# Patient Record
Sex: Male | Born: 1960 | Race: White | Hispanic: No | Marital: Married | Smoking: Current every day smoker
Health system: Southern US, Community
[De-identification: ages and names within clinical notes are randomized; demographics above are authoritative.]

---

## 2014-01-28 DIAGNOSIS — M5030 Other cervical disc degeneration, unspecified cervical region: Secondary | ICD-10-CM | POA: Insufficient documentation

## 2014-01-28 DIAGNOSIS — I639 Cerebral infarction, unspecified: Secondary | ICD-10-CM | POA: Insufficient documentation

## 2014-01-28 DIAGNOSIS — I739 Peripheral vascular disease, unspecified: Secondary | ICD-10-CM | POA: Insufficient documentation

## 2014-01-28 DIAGNOSIS — E7800 Pure hypercholesterolemia, unspecified: Secondary | ICD-10-CM | POA: Insufficient documentation

## 2014-01-28 DIAGNOSIS — M546 Pain in thoracic spine: Secondary | ICD-10-CM | POA: Insufficient documentation

## 2014-01-28 DIAGNOSIS — M47816 Spondylosis without myelopathy or radiculopathy, lumbar region: Secondary | ICD-10-CM | POA: Insufficient documentation

## 2014-01-28 DIAGNOSIS — F419 Anxiety disorder, unspecified: Secondary | ICD-10-CM | POA: Insufficient documentation

## 2014-01-28 DIAGNOSIS — M5136 Other intervertebral disc degeneration, lumbar region: Secondary | ICD-10-CM | POA: Insufficient documentation

## 2014-01-28 DIAGNOSIS — I10 Essential (primary) hypertension: Secondary | ICD-10-CM | POA: Insufficient documentation

## 2014-02-18 DIAGNOSIS — M5410 Radiculopathy, site unspecified: Secondary | ICD-10-CM | POA: Insufficient documentation

## 2014-03-11 DIAGNOSIS — Z8673 Personal history of transient ischemic attack (TIA), and cerebral infarction without residual deficits: Secondary | ICD-10-CM | POA: Insufficient documentation

## 2014-03-26 DIAGNOSIS — M5412 Radiculopathy, cervical region: Secondary | ICD-10-CM | POA: Insufficient documentation

## 2014-03-26 DIAGNOSIS — M542 Cervicalgia: Secondary | ICD-10-CM | POA: Insufficient documentation

## 2014-03-26 DIAGNOSIS — M4802 Spinal stenosis, cervical region: Secondary | ICD-10-CM | POA: Insufficient documentation

## 2014-03-26 DIAGNOSIS — M5020 Other cervical disc displacement, unspecified cervical region: Secondary | ICD-10-CM | POA: Insufficient documentation

## 2014-03-26 DIAGNOSIS — M5030 Other cervical disc degeneration, unspecified cervical region: Secondary | ICD-10-CM | POA: Insufficient documentation

## 2014-04-01 ENCOUNTER — Other Ambulatory Visit: Payer: Self-pay | Admitting: Neurosurgery

## 2014-04-01 DIAGNOSIS — M5412 Radiculopathy, cervical region: Secondary | ICD-10-CM

## 2014-04-02 ENCOUNTER — Ambulatory Visit
Admission: RE | Admit: 2014-04-02 | Discharge: 2014-04-02 | Disposition: A | Discharge status: Home / Self Care | Payer: PRIVATE HEALTH INSURANCE | Source: Ambulatory Visit | Attending: Neurosurgery | Admitting: Neurosurgery

## 2014-04-02 DIAGNOSIS — M5412 Radiculopathy, cervical region: Secondary | ICD-10-CM

## 2014-04-02 MED ORDER — IOHEXOL 300 MG/ML  SOLN
1.0000 mL | Freq: Once | INTRAMUSCULAR | Status: AC | PRN
  Administered 2014-04-02: 1 mL via EPIDURAL

## 2014-04-02 MED ORDER — TRIAMCINOLONE ACETONIDE 40 MG/ML IJ SUSP (RADIOLOGY)
40.0000 mg | Freq: Once | INTRAMUSCULAR | Status: AC
  Administered 2014-04-02: 40 mg via EPIDURAL

## 2014-04-02 NOTE — Discharge Instructions (Signed)

## 2014-04-05 ENCOUNTER — Other Ambulatory Visit: Payer: Self-pay

## 2014-04-10 ENCOUNTER — Other Ambulatory Visit: Payer: Self-pay | Admitting: Neurosurgery

## 2014-04-10 DIAGNOSIS — M542 Cervicalgia: Secondary | ICD-10-CM

## 2014-04-12 ENCOUNTER — Ambulatory Visit
Admission: RE | Admit: 2014-04-12 | Discharge: 2014-04-12 | Disposition: A | Discharge status: Home / Self Care | Payer: PRIVATE HEALTH INSURANCE | Source: Ambulatory Visit | Attending: Neurosurgery | Admitting: Neurosurgery

## 2014-04-12 DIAGNOSIS — M542 Cervicalgia: Secondary | ICD-10-CM

## 2014-04-12 MED ORDER — TRIAMCINOLONE ACETONIDE 40 MG/ML IJ SUSP (RADIOLOGY)
60.0000 mg | Freq: Once | INTRAMUSCULAR | Status: AC
  Administered 2014-04-12: 60 mg via EPIDURAL

## 2014-04-12 MED ORDER — IOHEXOL 300 MG/ML  SOLN
1.0000 mL | Freq: Once | INTRAMUSCULAR | Status: AC | PRN
  Administered 2014-04-12: 1 mL via EPIDURAL

## 2014-04-12 NOTE — Progress Notes (Signed)
Patient states he has been off Plavix for the past five days.  Jeanne Lohr, RN 

## 2014-04-16 ENCOUNTER — Other Ambulatory Visit: Payer: PRIVATE HEALTH INSURANCE

## 2014-05-10 ENCOUNTER — Other Ambulatory Visit: Payer: Self-pay | Admitting: Neurosurgery

## 2014-05-10 DIAGNOSIS — M5412 Radiculopathy, cervical region: Secondary | ICD-10-CM

## 2014-05-16 ENCOUNTER — Ambulatory Visit
Admission: RE | Admit: 2014-05-16 | Discharge: 2014-05-16 | Disposition: A | Discharge status: Home / Self Care | Payer: PRIVATE HEALTH INSURANCE | Source: Ambulatory Visit | Attending: Neurosurgery | Admitting: Neurosurgery

## 2014-05-16 DIAGNOSIS — M5412 Radiculopathy, cervical region: Secondary | ICD-10-CM

## 2014-05-16 MED ORDER — IOHEXOL 300 MG/ML  SOLN
1.0000 mL | Freq: Once | INTRAMUSCULAR | Status: AC | PRN
  Administered 2014-05-16: 1 mL via EPIDURAL

## 2014-05-16 MED ORDER — TRIAMCINOLONE ACETONIDE 40 MG/ML IJ SUSP (RADIOLOGY)
60.0000 mg | Freq: Once | INTRAMUSCULAR | Status: AC
  Administered 2014-05-16: 60 mg via EPIDURAL

## 2017-09-01 ENCOUNTER — Encounter: Payer: Self-pay | Admitting: Podiatry

## 2017-09-01 ENCOUNTER — Ambulatory Visit (INDEPENDENT_AMBULATORY_CARE_PROVIDER_SITE_OTHER): Payer: POS | Admitting: Podiatry

## 2017-09-01 VITALS — BP 146/65 | HR 68 | Ht 73.0 in | Wt 225.0 lb

## 2017-09-01 DIAGNOSIS — M6700 Short Achilles tendon (acquired), unspecified ankle: Secondary | ICD-10-CM

## 2017-09-01 DIAGNOSIS — M216X2 Other acquired deformities of left foot: Secondary | ICD-10-CM

## 2017-09-01 DIAGNOSIS — M7742 Metatarsalgia, left foot: Secondary | ICD-10-CM

## 2017-09-01 DIAGNOSIS — M216X1 Other acquired deformities of right foot: Secondary | ICD-10-CM

## 2017-09-01 DIAGNOSIS — M21969 Unspecified acquired deformity of unspecified lower leg: Secondary | ICD-10-CM | POA: Diagnosis not present

## 2017-09-01 DIAGNOSIS — M7741 Metatarsalgia, right foot: Secondary | ICD-10-CM | POA: Diagnosis not present

## 2017-09-01 NOTE — Progress Notes (Signed)
SUBJECTIVE: 57 y.o. year old male presents complaining of aching feet for 5-6 years. Especially at night feeling unusual feeling. Diagnosed with diabetic, A1c was 6.5 but not on medication.  Review of Systems  Constitutional: Negative.   HENT: Negative.   Eyes: Negative.   Respiratory:       Diagnosed with COPD 6-7 years ago.  Cardiovascular:       History of jumping heart beat 6-7 years ago and under managed through medication.  Stent in lower limb artery.  Gastrointestinal: Negative.   Genitourinary: Negative.   Musculoskeletal:       Right hip and lower back pain. Right shoulder surgery, repair rotator cuff in December 2018.   Skin: Negative.      OBJECTIVE: DERMATOLOGIC EXAMINATION: No abnormal findings.  VASCULAR EXAMINATION OF LOWER LIMBS: All pedal pulses are palpable with normal pulsation.  Capillary Filling times within 3 seconds in all digits.  No edema or erythema noted. Temperature gradient from tibial crest to dorsum of foot is within normal bilateral.  NEUROLOGIC EXAMINATION OF THE LOWER LIMBS: All epicritic and tactile sensations grossly intact. Sharp and Dull discriminatory sensations at the plantar ball of hallux is intact bilateral.   MUSCULOSKELETAL EXAMINATION: Positive for tight Achilles tendon bilateral. Unable to flex beyond 90 degrees at ankle joint bilateral. Positive for excess sagittal plane motion of the first ray, STJ hyperpronation with weight bearing bilateral.  RADIOGRAPHIC STUDIES:  AP View:  Adducted metatarsal with Fibular sesamoid position at 3 on left, 4 on right. Increased CCJ angle right foot. Lateral view:  High arched cavus type foot bilateral. Severe first ray elevation bilateral with dorsal spur at the first metatarsal head on right.   ASSESSMENT: Lesser metatarsalgia bilateral due to lateral weight shifting. Hypermobile first ray bilateral. Achilles tendon contracture bilateral. Hyperpronation  bilateral.  PLAN: Reviewed findings and available treatment options. Advised custom orthotics and stretch exercise.

## 2017-09-01 NOTE — Patient Instructions (Signed)
Seen for painful feet. Noted of tight Achilles tendon and weak first metatarsal bone with lateral weight shifting. Reviewed available treatment options, stretch exercise and custom orthotics. Will contact insurance for orthotic benefits.

## 2017-09-02 ENCOUNTER — Telehealth: Payer: Self-pay | Admitting: *Deleted

## 2017-09-02 NOTE — Telephone Encounter (Signed)
Patients insurance does not cover custom molded orthotics(cpt L3020). Called and left message for patient to call back.

## 2017-09-22 NOTE — Telephone Encounter (Signed)
Information discussed with pt. Pt would like to proceed with self pay for orthotics. Appt made advised $150.00 due at appt, and $150.00 due at pick up.

## 2017-09-29 ENCOUNTER — Ambulatory Visit (INDEPENDENT_AMBULATORY_CARE_PROVIDER_SITE_OTHER): Payer: POS | Admitting: Podiatry

## 2017-09-29 ENCOUNTER — Encounter: Payer: Self-pay | Admitting: Podiatry

## 2017-09-29 DIAGNOSIS — M7742 Metatarsalgia, left foot: Secondary | ICD-10-CM

## 2017-09-29 DIAGNOSIS — M21969 Unspecified acquired deformity of unspecified lower leg: Secondary | ICD-10-CM

## 2017-09-29 DIAGNOSIS — M6700 Short Achilles tendon (acquired), unspecified ankle: Secondary | ICD-10-CM

## 2017-09-29 DIAGNOSIS — M216X2 Other acquired deformities of left foot: Secondary | ICD-10-CM | POA: Diagnosis not present

## 2017-09-29 DIAGNOSIS — M216X1 Other acquired deformities of right foot: Secondary | ICD-10-CM | POA: Diagnosis not present

## 2017-09-29 DIAGNOSIS — M7741 Metatarsalgia, right foot: Secondary | ICD-10-CM

## 2017-09-29 NOTE — Patient Instructions (Signed)
Both feet casted for orthotics. X-ray findings reviewed.

## 2017-09-29 NOTE — Progress Notes (Signed)
Patient came in to have fitted for custom orthotics. Both feet casted for orthotics. Dx. Metatarsalgia, deformed metatarsal, Achilles tendon contractures bilateral. Pronation bilateral. X-ray show cavus foot with elevated first ray and spur at dorsal aspect of the first metatarsal right foot. Reviewed x-ray findings.

## 2017-11-03 ENCOUNTER — Telehealth: Payer: Self-pay | Admitting: *Deleted

## 2017-11-03 NOTE — Telephone Encounter (Signed)
Patient came by the office yesterday and picked up orthotics. He will call back to scheduled an appointment after he has worn them for 1 month

## 2018-09-06 DEATH — deceased
